# Patient Record
Sex: Female | Born: 1980 | Race: White | Hispanic: No | Marital: Married | Smoking: Never smoker
Health system: Southern US, Community
[De-identification: ages and names within clinical notes are randomized; demographics above are authoritative.]

## PROBLEM LIST (undated history)

## (undated) DIAGNOSIS — F329 Major depressive disorder, single episode, unspecified: Secondary | ICD-10-CM

## (undated) DIAGNOSIS — R61 Generalized hyperhidrosis: Secondary | ICD-10-CM

## (undated) DIAGNOSIS — F32A Depression, unspecified: Secondary | ICD-10-CM

## (undated) DIAGNOSIS — D649 Anemia, unspecified: Secondary | ICD-10-CM

## (undated) DIAGNOSIS — M329 Systemic lupus erythematosus, unspecified: Secondary | ICD-10-CM

## (undated) DIAGNOSIS — R5383 Other fatigue: Secondary | ICD-10-CM

## (undated) DIAGNOSIS — D72819 Decreased white blood cell count, unspecified: Secondary | ICD-10-CM

## (undated) DIAGNOSIS — IMO0002 Reserved for concepts with insufficient information to code with codable children: Secondary | ICD-10-CM

## (undated) DIAGNOSIS — D696 Thrombocytopenia, unspecified: Secondary | ICD-10-CM

## (undated) DIAGNOSIS — F909 Attention-deficit hyperactivity disorder, unspecified type: Secondary | ICD-10-CM

## (undated) HISTORY — DX: Thrombocytopenia, unspecified: D69.6

## (undated) HISTORY — PX: LAPAROSCOPY: SHX197

## (undated) HISTORY — DX: Decreased white blood cell count, unspecified: D72.819

## (undated) HISTORY — DX: Generalized hyperhidrosis: R61

## (undated) HISTORY — DX: Other fatigue: R53.83

## (undated) HISTORY — PX: LEEP: SHX91

---

## 1998-11-15 ENCOUNTER — Encounter: Payer: Self-pay | Admitting: Emergency Medicine

## 1998-11-15 ENCOUNTER — Emergency Department (HOSPITAL_COMMUNITY): Admission: EM | Admit: 1998-11-15 | Discharge: 1998-11-15 | Payer: Self-pay | Admitting: Emergency Medicine

## 1998-12-06 ENCOUNTER — Ambulatory Visit (HOSPITAL_COMMUNITY): Admission: RE | Admit: 1998-12-06 | Discharge: 1998-12-06 | Payer: Self-pay | Admitting: Interventional Cardiology

## 1998-12-07 ENCOUNTER — Encounter: Payer: Self-pay | Admitting: Interventional Cardiology

## 2006-12-16 ENCOUNTER — Ambulatory Visit: Payer: Self-pay | Admitting: Obstetrics and Gynecology

## 2012-11-10 ENCOUNTER — Other Ambulatory Visit: Payer: Self-pay | Admitting: Family Medicine

## 2012-11-10 ENCOUNTER — Other Ambulatory Visit (HOSPITAL_COMMUNITY)
Admission: RE | Admit: 2012-11-10 | Discharge: 2012-11-10 | Disposition: A | Discharge status: Home / Self Care | Payer: BC Managed Care – PPO | Source: Ambulatory Visit | Attending: Family Medicine | Admitting: Family Medicine

## 2012-11-10 DIAGNOSIS — Z0000 Encounter for general adult medical examination without abnormal findings: Secondary | ICD-10-CM | POA: Insufficient documentation

## 2013-05-04 ENCOUNTER — Ambulatory Visit: Payer: Self-pay | Admitting: Hematology and Oncology

## 2013-05-04 ENCOUNTER — Ambulatory Visit: Payer: Self-pay | Admitting: Internal Medicine

## 2013-05-04 LAB — CBC CANCER CENTER
Basophil #: 0 x10 3/mm (ref 0.0–0.1)
Basophil %: 0.1 %
HGB: 12.8 g/dL (ref 12.0–16.0)
Lymphocyte %: 42.7 %
Monocyte #: 0.4 x10 3/mm (ref 0.2–0.9)
Monocyte %: 12 %
Neutrophil %: 44.1 %
Platelet: 138 x10 3/mm — ABNORMAL LOW (ref 150–440)
RBC: 4.25 10*6/uL (ref 3.80–5.20)
RDW: 13.2 % (ref 11.5–14.5)
WBC: 3 x10 3/mm — ABNORMAL LOW (ref 3.6–11.0)

## 2013-05-04 LAB — CREATININE, SERUM
Creatinine: 0.85 mg/dL (ref 0.60–1.30)
EGFR (African American): >60

## 2013-05-04 LAB — IRON AND TIBC: Iron: 90 ug/dL (ref 50–170)

## 2013-05-04 LAB — PROTIME-INR
INR: 1
Prothrombin Time: 13.2 secs (ref 11.5–14.7)

## 2013-05-04 LAB — LACTATE DEHYDROGENASE: LDH: 145 U/L (ref 81–246)

## 2013-05-11 LAB — CBC CANCER CENTER
Basophil #: 0 x10 3/mm (ref 0.0–0.1)
Eosinophil #: 0 x10 3/mm (ref 0.0–0.7)
HGB: 12.2 g/dL (ref 12.0–16.0)
Lymphocyte %: 46.5 %
Monocyte #: 0.4 x10 3/mm (ref 0.2–0.9)
Monocyte %: 12.6 %
Platelet: 121 x10 3/mm — ABNORMAL LOW (ref 150–440)
RBC: 3.97 10*6/uL (ref 3.80–5.20)
RDW: 13.3 % (ref 11.5–14.5)
WBC: 2.9 x10 3/mm — ABNORMAL LOW (ref 3.6–11.0)

## 2013-05-11 LAB — URINALYSIS, COMPLETE
Bacteria: NEGATIVE
Bilirubin,UR: NEGATIVE
Budding Yeast: NONE SEEN
Hyaline Cast: NONE SEEN
Leukocyte Esterase: NEGATIVE
Nitrite: NEGATIVE
Ph: 6 (ref 4.5–8.0)
RBC,UR: NONE SEEN /HPF (ref 0–5)
Specific Gravity: 1.025 (ref 1.003–1.030)
Trichomonas: NONE SEEN

## 2013-05-21 ENCOUNTER — Ambulatory Visit: Payer: Self-pay | Admitting: Hematology and Oncology

## 2013-05-21 ENCOUNTER — Ambulatory Visit: Payer: Self-pay | Admitting: Internal Medicine

## 2013-05-26 LAB — CBC CANCER CENTER
Basophil #: 0 x10 3/mm (ref 0.0–0.1)
Eosinophil %: 0.6 %
Lymphocyte %: 43.4 %
MCH: 31.4 pg (ref 26.0–34.0)
MCHC: 35 g/dL (ref 32.0–36.0)
MCV: 90 fL (ref 80–100)
Monocyte #: 0.3 x10 3/mm (ref 0.2–0.9)
Monocyte %: 11.3 %
Neutrophil #: 1.3 x10 3/mm — ABNORMAL LOW (ref 1.4–6.5)
Neutrophil %: 44.6 %
RBC: 3.93 10*6/uL (ref 3.80–5.20)
RDW: 13.3 % (ref 11.5–14.5)

## 2013-05-27 ENCOUNTER — Ambulatory Visit: Payer: Self-pay | Admitting: Internal Medicine

## 2013-06-08 ENCOUNTER — Encounter (INDEPENDENT_AMBULATORY_CARE_PROVIDER_SITE_OTHER): Payer: Self-pay

## 2013-06-08 ENCOUNTER — Telehealth (INDEPENDENT_AMBULATORY_CARE_PROVIDER_SITE_OTHER): Payer: Self-pay

## 2013-06-08 NOTE — Telephone Encounter (Signed)
LM with Dr. Paula Compton office requesting pt bring films and reports of any mammograms performed at their facility.  If not performed at their facility, we need to know where so that films and reports can be requested prior to pt's 06/22/13 appointment with Dr. Abbey Chatters.

## 2013-06-08 NOTE — Telephone Encounter (Signed)
LMOV.  Pt will need to bring films and reports of any mammograms performed at [facility] to her 06/22/13 appointment with Dr. Donell Beers.  If not performed there, will need to bring films and reports from the correct facility.  Message also left with Dr. Paula Compton office.

## 2013-06-09 NOTE — Telephone Encounter (Signed)
Patient called back at this time and will go by to get the CD with her CT scan.  Patient has not had a mammogram done and was told by the referring physician he wasn't going to order one since we would want to get our own.  Patient has switched her appt from Dr. Donell Beers to Dr. Carolynne Edouard.

## 2013-06-10 ENCOUNTER — Encounter (INDEPENDENT_AMBULATORY_CARE_PROVIDER_SITE_OTHER): Payer: Self-pay | Admitting: General Surgery

## 2013-06-10 ENCOUNTER — Ambulatory Visit (INDEPENDENT_AMBULATORY_CARE_PROVIDER_SITE_OTHER): Payer: BC Managed Care – PPO | Admitting: General Surgery

## 2013-06-10 VITALS — BP 110/78 | HR 84 | Resp 20 | Ht 64.0 in | Wt 150.0 lb

## 2013-06-10 DIAGNOSIS — Q5039 Other congenital malformation of ovary: Secondary | ICD-10-CM

## 2013-06-10 DIAGNOSIS — R590 Localized enlarged lymph nodes: Secondary | ICD-10-CM | POA: Insufficient documentation

## 2013-06-10 DIAGNOSIS — R599 Enlarged lymph nodes, unspecified: Secondary | ICD-10-CM

## 2013-06-10 NOTE — Progress Notes (Signed)
Patient ID: Jennifer Bates, female   DOB: 1981/10/07, 32 y.o.   MRN: [id number]  Chief Complaint  Patient presents with  . New Evaluation    eval nipple discharge   . Biopsy    HPI Jennifer Bates is a 32 y.o. female.  We are asked to see the patient in consultation by Dr. Lorre Nick to evaluate her for abnormal axillary lymph nodes. The patient initially went for a routine checkup when she told her medical doctor that she is just not been feeling very well. She had been complaining of shortness of breath with minimal activity. She has also been experiencing night sweats for the last 3-6 months. She did have an episode of clear discharge from her right nipple about 2 months ago. She has changed her diet and has had an intentional weight loss of 13 pounds. She denies any abdominal pain. Her appetite has been good and her bowels are working normally. She had blood work that showed a low white count and low platelets. There is also some question on her blood work of an autoimmune abnormality. She also had a PET scan that suggested enlarged and hypermetabolic axillary lymph nodes bilaterally as well as a hypermetabolic left ovary.  HPI  Past Medical History  Diagnosis Date  . Thrombocytopenia   . Leukopenia   . Fatigue   . Night sweats     Past Surgical History  Procedure Laterality Date  . Cesarean section      2004 & 2006    Family History  Problem Relation Age of Onset  . Cancer Paternal Grandmother     stomach / ovarian  . Cancer Maternal Grandfather     colon    Social History History  Substance Use Topics  . Smoking status: Never Smoker   . Smokeless tobacco: Never Used  . Alcohol Use: No    Allergies  Allergen Reactions  . Strattera [Atomoxetine Hcl] Rash  . Sulfa Antibiotics Rash    No current outpatient prescriptions on file.   No current facility-administered medications for this visit.    Review of Systems Review of Systems  Constitutional: Negative.   HENT:  Negative.   Eyes: Negative.   Respiratory: Negative.   Cardiovascular: Negative.   Gastrointestinal: Negative.   Endocrine: Negative.   Genitourinary: Negative.   Musculoskeletal: Negative.   Skin: Negative.   Allergic/Immunologic: Negative.   Neurological: Negative.   Hematological: Negative.   Psychiatric/Behavioral: Negative.     Blood pressure 110/78, pulse 84, resp. rate 20, height 5\' 4"  (1.626 m), weight 150 lb (68.04 kg).  Physical Exam Physical Exam  Constitutional: She is oriented to person, place, and time. She appears well-developed and well-nourished.  HENT:  Head: Normocephalic and atraumatic.  Eyes: Conjunctivae and EOM are normal. Pupils are equal, round, and reactive to light.  Neck: Normal range of motion. Neck supple.  Cardiovascular: Normal rate, regular rhythm and normal heart sounds.   Pulmonary/Chest: Effort normal and breath sounds normal.  She has typical nodular fibrocystic tissue bilaterally but no significant palpable mass in either breast. There is no palpable axillary, cervical, or supraclavicular lymphadenopathy.  Abdominal: Soft. Bowel sounds are normal. She exhibits no mass. There is no tenderness.  Musculoskeletal: Normal range of motion.  Lymphadenopathy:    She has no cervical adenopathy.  Neurological: She is alert and oriented to person, place, and time.  Skin: Skin is warm and dry.  Psychiatric: She has a normal mood and affect. Her behavior is normal.  Data Reviewed As above  Assessment    The patient appears to have abnormal bilateral axillary lymph nodes by PET scan but clinically her nodes are nonpalpable. She has had some breast symptoms. Because of this we will start with a mammogram and ultrasound evaluation of her breast and axilla. We will also have her undergo a pelvic ultrasound because of the abnormal left ovary on PET scan. Once we have this information we will decide whether it would be appropriate to do a axillary lymph  node biopsy.     Plan    Plan for mammogram and ultrasound of her breasts as well as pelvic ultrasound. We will see her back in about 2 weeks to go over the results of the studies        TOTH III,PAUL S 06/10/2013, 4:57 PM

## 2013-06-11 ENCOUNTER — Telehealth (INDEPENDENT_AMBULATORY_CARE_PROVIDER_SITE_OTHER): Payer: Self-pay | Admitting: General Surgery

## 2013-06-11 NOTE — Telephone Encounter (Signed)
Spoke with pt and informed her that I scheduled her for her US pelvis at [facility] located at [address] on 07/01/13 at 8:45 and explained that she will need a full bladder for this procedure.  Also explained that she is set up with the [facility] on 07/01/13 at 10:30 for her MGM and [place].  We also rescheduled for f/u appt w/ Dr. 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

## 2013-06-21 ENCOUNTER — Ambulatory Visit: Payer: Self-pay | Admitting: Hematology and Oncology

## 2013-06-21 ENCOUNTER — Ambulatory Visit: Payer: Self-pay

## 2013-06-22 ENCOUNTER — Ambulatory Visit (INDEPENDENT_AMBULATORY_CARE_PROVIDER_SITE_OTHER): Payer: BC Managed Care – PPO | Admitting: General Surgery

## 2013-06-25 ENCOUNTER — Encounter (INDEPENDENT_AMBULATORY_CARE_PROVIDER_SITE_OTHER): Payer: BC Managed Care – PPO | Admitting: General Surgery

## 2013-07-01 ENCOUNTER — Ambulatory Visit
Admission: RE | Admit: 2013-07-01 | Discharge: 2013-07-01 | Disposition: A | Discharge status: Home / Self Care | Payer: BC Managed Care – PPO | Source: Ambulatory Visit | Attending: General Surgery | Admitting: General Surgery

## 2013-07-01 ENCOUNTER — Other Ambulatory Visit (INDEPENDENT_AMBULATORY_CARE_PROVIDER_SITE_OTHER): Payer: Self-pay | Admitting: General Surgery

## 2013-07-01 DIAGNOSIS — R590 Localized enlarged lymph nodes: Secondary | ICD-10-CM

## 2013-07-01 DIAGNOSIS — Q5039 Other congenital malformation of ovary: Secondary | ICD-10-CM

## 2013-07-05 ENCOUNTER — Ambulatory Visit (INDEPENDENT_AMBULATORY_CARE_PROVIDER_SITE_OTHER): Payer: BC Managed Care – PPO | Admitting: General Surgery

## 2013-07-05 ENCOUNTER — Encounter (INDEPENDENT_AMBULATORY_CARE_PROVIDER_SITE_OTHER): Payer: Self-pay | Admitting: General Surgery

## 2013-07-05 VITALS — BP 122/70 | HR 104 | Resp 16 | Ht 64.0 in | Wt 149.6 lb

## 2013-07-05 DIAGNOSIS — R590 Localized enlarged lymph nodes: Secondary | ICD-10-CM

## 2013-07-05 DIAGNOSIS — R599 Enlarged lymph nodes, unspecified: Secondary | ICD-10-CM

## 2013-07-05 NOTE — Patient Instructions (Signed)
Will refer to rheumatologist once we have a name

## 2013-07-05 NOTE — Progress Notes (Signed)
Subjective:     Patient ID: Jennifer Bates, female   DOB: Jul 05, 1981, 32 y.o.   MRN: [id number]  HPI The patient is a 32 year old white female who we saw recently with symptoms of generalized malaise for the last few months. She had a PET scan that showed hypermetabolic lymph nodes in the right axilla and left ovary. Since her last visit we had her undergo an ultrasound-guided core biopsy of the abnormal lymph node in the right axilla since we were unable to palpate it. Her pathology showed this to be a benign reactive lymph node. It appears as though they were able to do flow cytometry On it to rule out lymphoma.  Review of Systems     Objective:   Physical Exam On exam she does have some bruising in the right axilla. She still does not have any palpable lymph nodes in either axilla.    Assessment:     The patient is still in the middle of the workup to rule out an autoimmune disorder versus a lymphoma     Plan:     At this point I think it would be reasonable to refer her to a rheumatologist to review her workup. We will be happy to refer her to a rheumatologist or she can cause with a minimal somebody she would like to see. Otherwise we will plan to see her back on a when necessary basis.

## 2013-07-08 ENCOUNTER — Telehealth (INDEPENDENT_AMBULATORY_CARE_PROVIDER_SITE_OTHER): Payer: Self-pay

## 2013-07-08 ENCOUNTER — Other Ambulatory Visit (INDEPENDENT_AMBULATORY_CARE_PROVIDER_SITE_OTHER): Payer: Self-pay | Admitting: General Surgery

## 2013-07-08 DIAGNOSIS — R590 Localized enlarged lymph nodes: Secondary | ICD-10-CM

## 2013-07-08 NOTE — Telephone Encounter (Signed)
Pt would like to be referred to Dr. Corliss Skains, Rheumatology.  She is also asking questions about her biopsy and testing for autoimmune disease.  Please call her.  She is at work today. [phone].  Ext. [phone].

## 2013-07-09 ENCOUNTER — Other Ambulatory Visit (INDEPENDENT_AMBULATORY_CARE_PROVIDER_SITE_OTHER): Payer: Self-pay

## 2013-07-09 DIAGNOSIS — R591 Generalized enlarged lymph nodes: Secondary | ICD-10-CM

## 2013-07-09 NOTE — Telephone Encounter (Signed)
LMOM> tried to call her back to see what her questions were. If she is wanting cytology results they are not back yet. If any other question please get them from her. I will call her back with answers.

## 2013-07-14 ENCOUNTER — Telehealth (INDEPENDENT_AMBULATORY_CARE_PROVIDER_SITE_OTHER): Payer: Self-pay

## 2013-07-14 NOTE — Telephone Encounter (Signed)
Per pts request appt request form and records refaxed to Dr Fatima Sanger office. Fax Confirmation received. Donnie at office stated they did receive records and request. This will be forwarded to [facility] the nurse and reviewed for appt. Donnie states this may take a few days for appt to be set. LMOM for pt with update on appt status.

## 2013-08-26 ENCOUNTER — Other Ambulatory Visit: Payer: Self-pay

## 2014-02-01 ENCOUNTER — Other Ambulatory Visit: Payer: Self-pay | Admitting: Family

## 2014-02-01 DIAGNOSIS — R1011 Right upper quadrant pain: Secondary | ICD-10-CM

## 2014-02-02 ENCOUNTER — Ambulatory Visit
Admission: RE | Admit: 2014-02-02 | Discharge: 2014-02-02 | Disposition: A | Discharge status: Home / Self Care | Payer: BC Managed Care – PPO | Source: Ambulatory Visit | Attending: Family | Admitting: Family

## 2014-02-02 DIAGNOSIS — R1011 Right upper quadrant pain: Secondary | ICD-10-CM

## 2014-08-29 ENCOUNTER — Other Ambulatory Visit (INDEPENDENT_AMBULATORY_CARE_PROVIDER_SITE_OTHER): Payer: Self-pay | Admitting: General Surgery

## 2014-09-08 ENCOUNTER — Telehealth (INDEPENDENT_AMBULATORY_CARE_PROVIDER_SITE_OTHER): Payer: Self-pay

## 2014-09-08 NOTE — Telephone Encounter (Signed)
lmom to call back for below path report

## 2014-09-08 NOTE — Telephone Encounter (Signed)
-----   Message from Chevis PrettyPaul Toth III, MD sent at 09/05/2014  9:05 AM EST ----- Benign lipoma

## 2015-06-09 ENCOUNTER — Encounter: Payer: BC Managed Care – PPO | Admitting: Obstetrics & Gynecology

## 2015-06-22 ENCOUNTER — Encounter: Payer: Self-pay | Admitting: Obstetrics and Gynecology

## 2015-06-22 ENCOUNTER — Ambulatory Visit (INDEPENDENT_AMBULATORY_CARE_PROVIDER_SITE_OTHER): Payer: BC Managed Care – PPO | Admitting: Obstetrics and Gynecology

## 2015-06-22 VITALS — BP 106/73 | HR 96 | Ht 64.0 in | Wt 171.0 lb

## 2015-06-22 DIAGNOSIS — Z1151 Encounter for screening for human papillomavirus (HPV): Secondary | ICD-10-CM

## 2015-06-22 DIAGNOSIS — Z124 Encounter for screening for malignant neoplasm of cervix: Secondary | ICD-10-CM

## 2015-06-22 DIAGNOSIS — Z01419 Encounter for gynecological examination (general) (routine) without abnormal findings: Secondary | ICD-10-CM

## 2015-06-22 LAB — TSH: TSH: 0.697 u[IU]/mL (ref 0.350–4.500)

## 2015-06-22 NOTE — Progress Notes (Signed)
  Subjective:     Jennifer Bates is a 34 y.o. female G2P2 with LMP 06/15/2015 and BMI 29 who is here for a comprehensive physical exam. The patient reports no problems. She is sexually active using vasectomy for contraception. She reports regular monthly menses lasting 5 days. She does endorse some night sweats and reports that her mother started menopause at the age of 9  Social History   Social History  . Marital Status: Married    Spouse Name: N/A  . Number of Children: N/A  . Years of Education: N/A   Occupational History  . Not on file.   Social History Main Topics  . Smoking status: Never Smoker   . Smokeless tobacco: Never Used  . Alcohol Use: No  . Drug Use: No  . Sexual Activity:    Partners: Male   Other Topics Concern  . Not on file   Social History Narrative   Health Maintenance  Topic Date Due  . HIV Screening  05/20/1994  . TETANUS/TDAP  05/20/1998  . INFLUENZA VACCINE  05/22/2015   Past Medical History  Diagnosis Date  . Thrombocytopenia   . Leukopenia   . Fatigue   . Night sweats    Past Surgical History  Procedure Laterality Date  . Cesarean section      2004 & 2006   Family History  Problem Relation Age of Onset  . Cancer Paternal Grandmother     stomach / ovarian  . Cancer Maternal Grandfather     colon       Review of Systems Pertinent items are noted in HPI.   Objective:  Blood pressure 106/73, pulse 96, height  (1.626 m), weight 171 lb (77.565 kg), last menstrual period 06/15/2015.     GENERAL: Well-developed, well-nourished female in no acute distress.  HEENT: Normocephalic, atraumatic. Sclerae anicteric.  NECK: Supple. Normal thyroid.  LUNGS: Clear to auscultation bilaterally.  HEART: Regular rate and rhythm. BREASTS: Symmetric in size. No palpable masses or lymphadenopathy, skin changes, or nipple drainage. ABDOMEN: Soft, nontender, nondistended. No organomegaly. PELVIC: Normal external female genitalia. Vagina is  pink and rugated.  Normal discharge. Normal appearing cervix. Uterus is normal in size. No adnexal mass or tenderness. EXTREMITIES: No cyanosis, clubbing, or edema, 2+ distal pulses.    Assessment:    Healthy female exam.      Plan:    pap smear collected Patient advised to perform monthly self breast exam  Will check a TSH See After Visit Summary for Counseling Recommendations

## 2015-06-23 LAB — CYTOLOGY - PAP

## 2016-09-30 ENCOUNTER — Other Ambulatory Visit: Payer: Self-pay | Admitting: Family

## 2016-09-30 DIAGNOSIS — R1011 Right upper quadrant pain: Secondary | ICD-10-CM

## 2016-10-01 ENCOUNTER — Other Ambulatory Visit: Payer: Self-pay | Admitting: Family

## 2016-10-01 DIAGNOSIS — N644 Mastodynia: Secondary | ICD-10-CM

## 2016-10-08 ENCOUNTER — Ambulatory Visit
Admission: RE | Admit: 2016-10-08 | Discharge: 2016-10-08 | Disposition: A | Discharge status: Home / Self Care | Payer: BC Managed Care – PPO | Source: Ambulatory Visit | Attending: Family | Admitting: Family

## 2016-10-08 DIAGNOSIS — N644 Mastodynia: Secondary | ICD-10-CM

## 2017-10-17 ENCOUNTER — Other Ambulatory Visit (HOSPITAL_COMMUNITY)
Admission: RE | Admit: 2017-10-17 | Discharge: 2017-10-17 | Disposition: A | Discharge status: Home / Self Care | Payer: BC Managed Care – PPO | Source: Ambulatory Visit | Attending: Nurse Practitioner | Admitting: Nurse Practitioner

## 2017-10-17 ENCOUNTER — Other Ambulatory Visit: Payer: Self-pay | Admitting: Nurse Practitioner

## 2017-10-17 DIAGNOSIS — Z124 Encounter for screening for malignant neoplasm of cervix: Secondary | ICD-10-CM | POA: Insufficient documentation

## 2017-10-24 LAB — CYTOLOGY - PAP
Adequacy: ABSENT — AB
Candida vaginitis: NEGATIVE
Chlamydia: NEGATIVE
HPV (WINDOPATH): DETECTED — AB
HPV 16/18/45 GENOTYPING: NEGATIVE
Neisseria Gonorrhea: NEGATIVE
Trichomonas: NEGATIVE

## 2018-04-20 ENCOUNTER — Other Ambulatory Visit: Payer: Self-pay | Admitting: Obstetrics and Gynecology

## 2018-06-29 NOTE — Patient Instructions (Addendum)
Your procedure is scheduled on:  Wednesday, 9/18  Enter through the Main Entrance of [facility] at: 12:45 pm  Pick up the phone at the desk and dial [phone].  Call this number if you have problems the morning of surgery: [phone].  Remember: Do NOT eat food after midnight Tuesday.  Do NOT drink clear liquids (including water) after: 8 am Wednesday, day of surgery.  Take these medicines the morning of surgery with a SIP OF WATER: valtrex if needed  Brush your teeth on the day of surgery.  Stop herbal medications, vitamin supplements, Ibuprofen/NSAIDS at this time.  Do NOT wear jewelry (body piercing), metal hair clips/bobby pins, make-up, or nail polish. Do NOT wear lotions, powders, or perfumes.  You may wear deoderant. Do NOT shave for 48 hours prior to surgery. Do NOT bring valuables to the hospital. Contacts may not be worn into surgery.  Leave suitcase in car.  After surgery it may be brought to your room.  For patients admitted to the hospital, checkout time is 11:00 AM the day of discharge. Home with Mother Boyd Kerbs cell [phone]

## 2018-07-01 ENCOUNTER — Other Ambulatory Visit: Payer: Self-pay | Admitting: Obstetrics and Gynecology

## 2018-07-03 ENCOUNTER — Encounter (HOSPITAL_COMMUNITY): Payer: Self-pay

## 2018-07-03 ENCOUNTER — Other Ambulatory Visit: Payer: Self-pay

## 2018-07-03 ENCOUNTER — Encounter (HOSPITAL_COMMUNITY)
Admission: RE | Admit: 2018-07-03 | Discharge: 2018-07-03 | Disposition: A | Discharge status: Home / Self Care | Payer: BC Managed Care – PPO | Source: Ambulatory Visit | Attending: Obstetrics and Gynecology | Admitting: Obstetrics and Gynecology

## 2018-07-03 DIAGNOSIS — Z01812 Encounter for preprocedural laboratory examination: Secondary | ICD-10-CM | POA: Insufficient documentation

## 2018-07-03 HISTORY — DX: Major depressive disorder, single episode, unspecified: F32.9

## 2018-07-03 HISTORY — DX: Depression, unspecified: F32.A

## 2018-07-03 HISTORY — DX: Attention-deficit hyperactivity disorder, unspecified type: F90.9

## 2018-07-03 HISTORY — DX: Anemia, unspecified: D64.9

## 2018-07-03 HISTORY — DX: Reserved for concepts with insufficient information to code with codable children: IMO0002

## 2018-07-03 HISTORY — DX: Systemic lupus erythematosus, unspecified: M32.9

## 2018-07-03 LAB — CBC
HCT: 36.7 % (ref 36.0–46.0)
Hemoglobin: 12.4 g/dL (ref 12.0–15.0)
MCH: 30.9 pg (ref 26.0–34.0)
MCHC: 33.8 g/dL (ref 30.0–36.0)
MCV: 91.5 fL (ref 78.0–100.0)
Platelets: 147 10*3/uL — ABNORMAL LOW (ref 150–400)
RBC: 4.01 MIL/uL (ref 3.87–5.11)
RDW: 13.3 % (ref 11.5–15.5)
WBC: 3.2 10*3/uL — ABNORMAL LOW (ref 4.0–10.5)

## 2018-07-03 LAB — TYPE AND SCREEN
ABO/RH(D): A POS
ANTIBODY SCREEN: NEGATIVE

## 2018-07-03 LAB — COMPREHENSIVE METABOLIC PANEL
ALK PHOS: 59 U/L (ref 38–126)
ALT: 11 U/L (ref 0–44)
AST: 18 U/L (ref 15–41)
Albumin: 4.4 g/dL (ref 3.5–5.0)
Anion gap: 7 (ref 5–15)
BUN: 14 mg/dL (ref 6–20)
CO2: 25 mmol/L (ref 22–32)
CREATININE: 0.78 mg/dL (ref 0.44–1.00)
Calcium: 8.9 mg/dL (ref 8.9–10.3)
Chloride: 107 mmol/L (ref 98–111)
GFR calc non Af Amer: >60 mL/min (ref >60)
Glucose, Bld: 90 mg/dL (ref 70–99)
Potassium: 4.8 mmol/L (ref 3.5–5.1)
Sodium: 139 mmol/L (ref 135–145)
Total Bilirubin: 0.2 mg/dL — ABNORMAL LOW (ref 0.3–1.2)
Total Protein: 7.3 g/dL (ref 6.5–8.1)

## 2018-07-03 LAB — ABO/RH: ABO/RH(D): A POS

## 2018-07-08 ENCOUNTER — Inpatient Hospital Stay (HOSPITAL_COMMUNITY): Payer: BC Managed Care – PPO | Admitting: Certified Registered Nurse Anesthetist

## 2018-07-08 ENCOUNTER — Other Ambulatory Visit: Payer: Self-pay

## 2018-07-08 ENCOUNTER — Other Ambulatory Visit: Payer: Self-pay | Admitting: Obstetrics and Gynecology

## 2018-07-08 ENCOUNTER — Encounter (HOSPITAL_COMMUNITY): Payer: Self-pay

## 2018-07-08 ENCOUNTER — Inpatient Hospital Stay (HOSPITAL_COMMUNITY)
Admission: AD | Admit: 2018-07-08 | Discharge: 2018-07-10 | DRG: 743 | Disposition: A | Discharge status: Home / Self Care | Payer: BC Managed Care – PPO | Attending: Obstetrics and Gynecology | Admitting: Obstetrics and Gynecology

## 2018-07-08 ENCOUNTER — Encounter (HOSPITAL_COMMUNITY)
Admission: AD | Disposition: A | Discharge status: Home / Self Care | Payer: Self-pay | Source: Home / Self Care | Attending: Obstetrics and Gynecology

## 2018-07-08 DIAGNOSIS — Z90710 Acquired absence of both cervix and uterus: Secondary | ICD-10-CM | POA: Diagnosis present

## 2018-07-08 DIAGNOSIS — Z98891 History of uterine scar from previous surgery: Secondary | ICD-10-CM | POA: Diagnosis not present

## 2018-07-08 DIAGNOSIS — N871 Moderate cervical dysplasia: Principal | ICD-10-CM | POA: Diagnosis present

## 2018-07-08 HISTORY — PX: HYSTERECTOMY ABDOMINAL WITH SALPINGECTOMY: SHX6725

## 2018-07-08 LAB — PREGNANCY, URINE: Preg Test, Ur: NEGATIVE

## 2018-07-08 SURGERY — HYSTERECTOMY, TOTAL, ABDOMINAL, WITH SALPINGECTOMY
Anesthesia: General | Site: Abdomen | Laterality: Bilateral

## 2018-07-08 MED ORDER — ONDANSETRON HCL 4 MG/2ML IJ SOLN
INTRAMUSCULAR | Status: DC | PRN
  Administered 2018-07-08: 4 mg via INTRAVENOUS

## 2018-07-08 MED ORDER — SUGAMMADEX SODIUM 200 MG/2ML IV SOLN
INTRAVENOUS | Status: DC | PRN
  Administered 2018-07-08: 133.4 mg via INTRAVENOUS

## 2018-07-08 MED ORDER — HYDROMORPHONE HCL 1 MG/ML IJ SOLN
0.2500 mg | INTRAMUSCULAR | Status: DC | PRN
  Administered 2018-07-08 (×2): 0.5 mg via INTRAVENOUS

## 2018-07-08 MED ORDER — HYDROMORPHONE HCL 1 MG/ML IJ SOLN
INTRAMUSCULAR | Status: AC
  Administered 2018-07-08: 0.5 mg via INTRAVENOUS
  Filled 2018-07-08: qty 0.5

## 2018-07-08 MED ORDER — LIDOCAINE HCL (CARDIAC) PF 100 MG/5ML IV SOSY
PREFILLED_SYRINGE | INTRAVENOUS | Status: AC
  Filled 2018-07-08: qty 5

## 2018-07-08 MED ORDER — SCOPOLAMINE 1 MG/3DAYS TD PT72
MEDICATED_PATCH | TRANSDERMAL | Status: AC
  Filled 2018-07-08: qty 1

## 2018-07-08 MED ORDER — DIPHENHYDRAMINE HCL 12.5 MG/5ML PO ELIX: 12.5000 mg | ORAL_SOLUTION | Freq: Four times a day (QID) | ORAL | Status: DC | PRN

## 2018-07-08 MED ORDER — ACETAMINOPHEN 10 MG/ML IV SOLN
1000.0000 mg | Freq: Once | INTRAVENOUS | Status: AC
  Administered 2018-07-08: 1000 mg via INTRAVENOUS

## 2018-07-08 MED ORDER — MEPERIDINE HCL 25 MG/ML IJ SOLN
6.2500 mg | INTRAMUSCULAR | Status: DC | PRN
  Administered 2018-07-08 (×2): 12.5 mg via INTRAVENOUS

## 2018-07-08 MED ORDER — ROCURONIUM BROMIDE 100 MG/10ML IV SOLN
INTRAVENOUS | Status: AC
  Filled 2018-07-08: qty 1

## 2018-07-08 MED ORDER — KETOROLAC TROMETHAMINE 30 MG/ML IJ SOLN
30.0000 mg | Freq: Four times a day (QID) | INTRAMUSCULAR | Status: DC
  Administered 2018-07-09 (×2): 30 mg via INTRAVENOUS
  Filled 2018-07-08 (×2): qty 1

## 2018-07-08 MED ORDER — IBUPROFEN 800 MG PO TABS
800.0000 mg | ORAL_TABLET | Freq: Three times a day (TID) | ORAL | Status: DC | PRN
  Administered 2018-07-09 – 2018-07-10 (×3): 800 mg via ORAL
  Filled 2018-07-08 (×3): qty 1

## 2018-07-08 MED ORDER — ONDANSETRON HCL 4 MG/2ML IJ SOLN: 4.0000 mg | Freq: Four times a day (QID) | INTRAMUSCULAR | Status: DC | PRN

## 2018-07-08 MED ORDER — DEXAMETHASONE SODIUM PHOSPHATE 4 MG/ML IJ SOLN
INTRAMUSCULAR | Status: AC
  Filled 2018-07-08: qty 1

## 2018-07-08 MED ORDER — PANTOPRAZOLE SODIUM 40 MG PO TBEC
40.0000 mg | DELAYED_RELEASE_TABLET | Freq: Every day | ORAL | Status: DC
  Administered 2018-07-09: 40 mg via ORAL
  Filled 2018-07-08: qty 1

## 2018-07-08 MED ORDER — SUGAMMADEX SODIUM 200 MG/2ML IV SOLN
INTRAVENOUS | Status: AC
  Filled 2018-07-08: qty 2

## 2018-07-08 MED ORDER — NALOXONE HCL 0.4 MG/ML IJ SOLN: 0.4000 mg | INTRAMUSCULAR | Status: DC | PRN

## 2018-07-08 MED ORDER — OXYCODONE-ACETAMINOPHEN 5-325 MG PO TABS
1.0000 | ORAL_TABLET | ORAL | Status: DC | PRN
  Administered 2018-07-09 – 2018-07-10 (×3): 1 via ORAL
  Filled 2018-07-08 (×3): qty 1

## 2018-07-08 MED ORDER — SCOPOLAMINE 1 MG/3DAYS TD PT72
1.0000 | MEDICATED_PATCH | Freq: Once | TRANSDERMAL | Status: DC
  Administered 2018-07-08: 1.5 mg via TRANSDERMAL

## 2018-07-08 MED ORDER — HYDROMORPHONE 1 MG/ML IV SOLN
INTRAVENOUS | Status: DC
  Administered 2018-07-08: 3.6 mg via INTRAVENOUS
  Administered 2018-07-08: 30 mg via INTRAVENOUS
  Administered 2018-07-09: 1.2 mg via INTRAVENOUS
  Administered 2018-07-09: 0.3 mg via INTRAVENOUS
  Filled 2018-07-08: qty 30

## 2018-07-08 MED ORDER — HYDROMORPHONE HCL 1 MG/ML IJ SOLN
INTRAMUSCULAR | Status: AC
  Administered 2018-07-08: 0.5 mg via INTRAVENOUS
  Filled 2018-07-08: qty 1

## 2018-07-08 MED ORDER — PROPOFOL 10 MG/ML IV BOLUS
INTRAVENOUS | Status: DC | PRN
  Administered 2018-07-08: 200 mg via INTRAVENOUS

## 2018-07-08 MED ORDER — FENTANYL CITRATE (PF) 100 MCG/2ML IJ SOLN
INTRAMUSCULAR | Status: DC | PRN
  Administered 2018-07-08 (×2): 100 ug via INTRAVENOUS
  Administered 2018-07-08: 50 ug via INTRAVENOUS
  Administered 2018-07-08: 100 ug via INTRAVENOUS
  Administered 2018-07-08: 50 ug via INTRAVENOUS
  Administered 2018-07-08: 100 ug via INTRAVENOUS

## 2018-07-08 MED ORDER — LACTATED RINGERS IV SOLN: INTRAVENOUS | Status: DC

## 2018-07-08 MED ORDER — VALACYCLOVIR HCL 500 MG PO TABS
500.0000 mg | ORAL_TABLET | Freq: Every evening | ORAL | Status: DC
  Administered 2018-07-08 – 2018-07-09 (×2): 500 mg via ORAL
  Filled 2018-07-08 (×2): qty 1

## 2018-07-08 MED ORDER — PROMETHAZINE HCL 25 MG/ML IJ SOLN
INTRAMUSCULAR | Status: AC
  Filled 2018-07-08: qty 1

## 2018-07-08 MED ORDER — ONDANSETRON HCL 4 MG/2ML IJ SOLN
INTRAMUSCULAR | Status: AC
  Filled 2018-07-08: qty 2

## 2018-07-08 MED ORDER — MENTHOL 3 MG MT LOZG: 1.0000 | LOZENGE | OROMUCOSAL | Status: DC | PRN

## 2018-07-08 MED ORDER — SIMETHICONE 80 MG PO CHEW
80.0000 mg | CHEWABLE_TABLET | Freq: Four times a day (QID) | ORAL | Status: DC | PRN
  Administered 2018-07-09 – 2018-07-10 (×4): 80 mg via ORAL
  Filled 2018-07-08 (×4): qty 1

## 2018-07-08 MED ORDER — MIDAZOLAM HCL 2 MG/2ML IJ SOLN
INTRAMUSCULAR | Status: AC
  Filled 2018-07-08: qty 2

## 2018-07-08 MED ORDER — PROPOFOL 10 MG/ML IV BOLUS
INTRAVENOUS | Status: AC
  Filled 2018-07-08: qty 20

## 2018-07-08 MED ORDER — OXYCODONE-ACETAMINOPHEN 5-325 MG PO TABS: 2.0000 | ORAL_TABLET | ORAL | Status: DC | PRN

## 2018-07-08 MED ORDER — GLYCOPYRROLATE 0.2 MG/ML IJ SOLN
INTRAMUSCULAR | Status: DC | PRN
  Administered 2018-07-08: 0.1 mg via INTRAVENOUS

## 2018-07-08 MED ORDER — SENNA 8.6 MG PO TABS
1.0000 | ORAL_TABLET | Freq: Two times a day (BID) | ORAL | Status: DC
  Administered 2018-07-08 – 2018-07-09 (×3): 8.6 mg via ORAL
  Filled 2018-07-08 (×4): qty 1

## 2018-07-08 MED ORDER — KETOROLAC TROMETHAMINE 30 MG/ML IJ SOLN
INTRAMUSCULAR | Status: AC
  Filled 2018-07-08: qty 1

## 2018-07-08 MED ORDER — METOCLOPRAMIDE HCL 5 MG/ML IJ SOLN
10.0000 mg | Freq: Once | INTRAMUSCULAR | Status: AC | PRN
  Administered 2018-07-08: 10 mg via INTRAVENOUS

## 2018-07-08 MED ORDER — FENTANYL CITRATE (PF) 250 MCG/5ML IJ SOLN
INTRAMUSCULAR | Status: AC
  Filled 2018-07-08: qty 5

## 2018-07-08 MED ORDER — HYDROMORPHONE HCL 1 MG/ML IJ SOLN
0.2500 mg | INTRAMUSCULAR | Status: DC | PRN
  Administered 2018-07-08 (×4): 0.5 mg via INTRAVENOUS

## 2018-07-08 MED ORDER — KETOROLAC TROMETHAMINE 30 MG/ML IJ SOLN
INTRAMUSCULAR | Status: DC | PRN
  Administered 2018-07-08: 30 mg via INTRAVENOUS

## 2018-07-08 MED ORDER — SODIUM CHLORIDE 0.9 % IR SOLN
Status: DC | PRN
  Administered 2018-07-08: 1000 mL

## 2018-07-08 MED ORDER — DIPHENHYDRAMINE HCL 50 MG/ML IJ SOLN: 12.5000 mg | Freq: Four times a day (QID) | INTRAMUSCULAR | Status: DC | PRN

## 2018-07-08 MED ORDER — ACETAMINOPHEN 10 MG/ML IV SOLN
INTRAVENOUS | Status: AC
  Administered 2018-07-08: 1000 mg via INTRAVENOUS
  Filled 2018-07-08: qty 100

## 2018-07-08 MED ORDER — ROCURONIUM BROMIDE 100 MG/10ML IV SOLN
INTRAVENOUS | Status: DC | PRN
  Administered 2018-07-08: 50 mg via INTRAVENOUS
  Administered 2018-07-08: 20 mg via INTRAVENOUS

## 2018-07-08 MED ORDER — SODIUM CHLORIDE 0.9 % IV SOLN
2.0000 g | INTRAVENOUS | Status: AC
  Administered 2018-07-08: 2 g via INTRAVENOUS

## 2018-07-08 MED ORDER — MEPERIDINE HCL 25 MG/ML IJ SOLN
INTRAMUSCULAR | Status: AC
  Administered 2018-07-08: 12.5 mg via INTRAVENOUS
  Filled 2018-07-08: qty 1

## 2018-07-08 MED ORDER — ALUM & MAG HYDROXIDE-SIMETH 200-200-20 MG/5ML PO SUSP: 30.0000 mL | ORAL | Status: DC | PRN

## 2018-07-08 MED ORDER — LIDOCAINE HCL (CARDIAC) PF 100 MG/5ML IV SOSY
PREFILLED_SYRINGE | INTRAVENOUS | Status: DC | PRN
  Administered 2018-07-08: 50 mg via INTRAVENOUS

## 2018-07-08 MED ORDER — SODIUM CHLORIDE 0.9 % IV SOLN
INTRAVENOUS | Status: AC
  Filled 2018-07-08: qty 2

## 2018-07-08 MED ORDER — LACTATED RINGERS IV SOLN
INTRAVENOUS | Status: DC
  Administered 2018-07-08: 16:00:00 via INTRAVENOUS
  Administered 2018-07-08: 125 mL/h via INTRAVENOUS
  Administered 2018-07-08: 15:00:00 via INTRAVENOUS

## 2018-07-08 MED ORDER — SODIUM CHLORIDE 0.9% FLUSH: 9.0000 mL | INTRAVENOUS | Status: DC | PRN

## 2018-07-08 MED ORDER — METOCLOPRAMIDE HCL 5 MG/ML IJ SOLN
INTRAMUSCULAR | Status: AC
  Administered 2018-07-08: 10 mg via INTRAVENOUS
  Filled 2018-07-08: qty 2

## 2018-07-08 MED ORDER — KETOROLAC TROMETHAMINE 30 MG/ML IJ SOLN: 30.0000 mg | Freq: Four times a day (QID) | INTRAMUSCULAR | Status: DC

## 2018-07-08 MED ORDER — ZOLPIDEM TARTRATE 5 MG PO TABS: 5.0000 mg | ORAL_TABLET | Freq: Every evening | ORAL | Status: DC | PRN

## 2018-07-08 MED ORDER — DEXAMETHASONE SODIUM PHOSPHATE 10 MG/ML IJ SOLN
INTRAMUSCULAR | Status: DC | PRN
  Administered 2018-07-08: 4 mg via INTRAVENOUS

## 2018-07-08 MED ORDER — MIDAZOLAM HCL 2 MG/2ML IJ SOLN
INTRAMUSCULAR | Status: DC | PRN
  Administered 2018-07-08: 2 mg via INTRAVENOUS

## 2018-07-08 SURGICAL SUPPLY — 41 items
BENZOIN TINCTURE PRP APPL 2/3 (GAUZE/BANDAGES/DRESSINGS) ×2 IMPLANT
CANISTER SUCT 3000ML PPV (MISCELLANEOUS) ×3 IMPLANT
CLOSURE WOUND 1/2 X4 (GAUZE/BANDAGES/DRESSINGS) ×1
CONT PATH 16OZ SNAP LID 3702 (MISCELLANEOUS) ×3 IMPLANT
DECANTER SPIKE VIAL GLASS SM (MISCELLANEOUS) IMPLANT
DRAPE WARM FLUID 44X44 (DRAPE) IMPLANT
DRSG OPSITE POSTOP 4X10 (GAUZE/BANDAGES/DRESSINGS) ×3 IMPLANT
DURAPREP 26ML APPLICATOR (WOUND CARE) ×3 IMPLANT
GAUZE 4X4 16PLY RFD (DISPOSABLE) ×3 IMPLANT
GAUZE SPONGE 4X4 12PLY STRL (GAUZE/BANDAGES/DRESSINGS) ×2 IMPLANT
GLOVE BIOGEL M 6.5 STRL (GLOVE) ×3 IMPLANT
GLOVE BIOGEL PI IND STRL 6.5 (GLOVE) ×1 IMPLANT
GLOVE BIOGEL PI IND STRL 7.0 (GLOVE) ×2 IMPLANT
GLOVE BIOGEL PI INDICATOR 6.5 (GLOVE) ×2
GLOVE BIOGEL PI INDICATOR 7.0 (GLOVE) ×4
GOWN STRL REUS W/TWL LRG LVL3 (GOWN DISPOSABLE) ×12 IMPLANT
HEMOSTAT ARISTA ABSORB 3G PWDR (MISCELLANEOUS) ×2 IMPLANT
NEEDLE HYPO 22GX1.5 SAFETY (NEEDLE) IMPLANT
NS IRRIG 1000ML POUR BTL (IV SOLUTION) ×3 IMPLANT
PACK ABDOMINAL GYN (CUSTOM PROCEDURE TRAY) ×3 IMPLANT
PAD ABD 8X7 1/2 STERILE (GAUZE/BANDAGES/DRESSINGS) ×2 IMPLANT
PAD OB MATERNITY 4.3X12.25 (PERSONAL CARE ITEMS) ×3 IMPLANT
PENCIL SMOKE EVAC W/HOLSTER (ELECTROSURGICAL) ×3 IMPLANT
PROTECTOR NERVE ULNAR (MISCELLANEOUS) ×3 IMPLANT
SPONGE LAP 18X18 X RAY DECT (DISPOSABLE) ×4 IMPLANT
STAPLER VISISTAT 35W (STAPLE) IMPLANT
STRIP CLOSURE SKIN 1/2X4 (GAUZE/BANDAGES/DRESSINGS) ×1 IMPLANT
SUT PDS AB 0 CT1 27 (SUTURE) ×6 IMPLANT
SUT VIC AB 0 CT1 27 (SUTURE) ×4
SUT VIC AB 0 CT1 27XCR 8 STRN (SUTURE) ×2 IMPLANT
SUT VIC AB 0 CT1 36 (SUTURE) ×3 IMPLANT
SUT VIC AB 2-0 CT1 (SUTURE) IMPLANT
SUT VIC AB 2-0 CT1 27 (SUTURE) ×2
SUT VIC AB 2-0 CT1 TAPERPNT 27 (SUTURE) ×1 IMPLANT
SUT VIC AB 2-0 SH 27 (SUTURE) ×2
SUT VIC AB 2-0 SH 27XBRD (SUTURE) ×1 IMPLANT
SUT VIC AB 4-0 KS 27 (SUTURE) IMPLANT
SUT VICRYL 0 TIES 12 18 (SUTURE) ×3 IMPLANT
SYR CONTROL 10ML LL (SYRINGE) IMPLANT
TOWEL OR 17X24 6PK STRL BLUE (TOWEL DISPOSABLE) ×6 IMPLANT
TRAY FOLEY W/BAG SLVR 14FR (SET/KITS/TRAYS/PACK) ×3 IMPLANT

## 2018-07-08 NOTE — Transfer of Care (Signed)
Immediate Anesthesia Transfer of Care Note  Patient: Jennifer SpatesAmber B Bates  Procedure(s) Performed: HYSTERECTOMY ABDOMINAL WITH SALPINGECTOMY (Bilateral Abdomen)  Patient Location: PACU  Anesthesia Type:General  Level of Consciousness: awake, alert  and oriented  Airway & Oxygen Therapy: Patient Spontanous Breathing and Patient connected to nasal cannula oxygen  Post-op Assessment: Report given to RN, Post -op Vital signs reviewed and stable and Patient moving all extremities  Post vital signs: Reviewed and stable  Last Vitals:  Vitals Value Taken Time  BP 136/85 07/08/2018  4:30 PM  Temp 36.9 C 07/08/2018  4:30 PM  Pulse 92 07/08/2018  4:34 PM  Resp 21 07/08/2018  4:34 PM  SpO2 100 % 07/08/2018  4:34 PM  Vitals shown include unvalidated device data.  Last Pain:  Vitals:   07/08/18 1224  TempSrc: Oral  PainSc: 0-No pain      Patients Stated Pain Goal: 4 (07/08/18 1224)  Complications: No apparent anesthesia complications

## 2018-07-08 NOTE — Op Note (Signed)
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   Pre-operative Diagnosis: Moderate cervical dysplasia CIN II  Post-operative Diagnosis: Same   Operation: Total abdominal hysterectomy with bilateral salpingectomy   Surgeon: Jessee AversOLE,Buzz Axel J.   Assistants: Dr. Myna HidalgoJennifer Ozan   Anesthesia: General endotracheal anesthesia  ASA Class: 2   Finding: normal appearing uterus  Fallopian tubes and ovaries.   Procedure Details  The patient was seen in the Holding Room. The risks, benefits, complications, treatment options, and expected outcomes were discussed with the patient.  The patient concurred with the proposed plan, giving informed consent.  The site of surgery properly noted/marked. The patient was taken to Operating Room # 7, identified as Jennifer Bates and the procedure verified as Total abdominal hysterectomy with bilatera salpingectomy . A Time Out was held and the above information confirmed.  After induction of anesthesia, the patient was draped and prepped in the usual sterile manner. Pt was placed in supine position after anesthesia and draped and prepped in the usual sterile manner. Foley catheter was placed.  A pfannenstiehl incision was made and carried through the subcutaneous tissue to the fascia. Fascial incision was made and extended Laterally . The rectus muscles were separated. The peritoneum was identified and entered. Peritoneal incision was extended longitudinally.  The above findings were noted. Andrey Spearman'Connor Sullivan  retractor was placed and bowel was packed away from the surgical site.   The round ligaments were identified, cut, and ligated with 0-Vicryl. The anterior peritoneal reflection was incised and the bladder was dissected off the lower uterine segment.  The right utero-ovarian ligament and proximal  fallopian tube were grasped, cut, and suture ligated with 0-Vicryl. The left utero-ovarian ligament and proximal fallopian tube were grasped, cut and suture ligated with 0-Vicryl. Hemostasis  was observed. The uterine vessels were skeletonized, then clamped, cut and suture ligated with 0-Vicryl suture. Serial pedicles of the cardinal and utero-sacral ligaments were clamped, cut, and suture ligated with 0-Vicryl. Entrance was made into the vagina and the uterus removed. Vaginal cuff angle sutures were placed incorporating the utero-sacral ligaments for support. The vaginal cuff was then closed with a running stitch of 0- Vicryl. Attention was turned to the right mesosalpinx which was transected. The right fallopian tube with clamped inferiorly with heany clamp. The fallopian tube was excised. The pedicle of the mesosalpinx was suture ligated with 0 vicryl. This was repeated on the left mesosalpinx and left fallopian tube.   Lavage was carried out until clear. Hemostasis was observed. Arista was placed over the vaginal cuff.   Retractor and all packing was removed from the abdomen. The fascia was approximated with running sutures of 0-Vicryl. Lavage was again carried out. Hemostasis was observed. The skin was approximated with staples.  Instrument, sponge, and needle counts were correct prior to abdominal closure and at the conclusion of the case.     Estimated Blood Loss:  125 mL         Drains: Foley catheter         Total IV Fluids: 2000 ml         Specimens: uterus cervix and bilateral fallopian tubes          Implants: None         Complications:  None; patient tolerated the procedure well.  Disposition: PACU - hemodynamically stable.         Condition: stable  Attending Attestation: I performed the procedure.

## 2018-07-08 NOTE — H&P (Signed)
Chief Complaint(s):   recurrent cervical dysplasia   HPI:  General 37 y/o presents for preop history and physical exam . she is scheduled for total abdominal hysterectomy due to recurrent cervical dysplaasia. colposcopy results from 04/20/2018. ECC shows CIN I.. Cervical polyp that was removed shows CIN I and II.Jennifer Bates Her history is significant for LGSIL pap smear with high risk hpv detected 09/2017. She was referred by NP Levonne Lapping.  she was noted to have a cervical polyp on exam with her first visit with me.  she has a h/o Cervical dysplasia in the past treated with LEEP twice. she is interested in hysterectomy due to recurrent cervical dysplasia.Jennifer Bates she has not interest in future childbearing. Current Medication: Taking  OTC Juice Plus Suppliment one per day     Pantoprazole Sodium 40 MG Tablet Delayed Release 1 tablet Orally Once a day, Notes: prn     Vyvanse(Lisdexamfetamine Dimesylate) 60 MG Capsule 1 capsule in the morning Orally Once a day, Notes: see dose change     Valacyclovir HCl 500 MG Tablet 1 tablet Orally Once a day     Slow Fe(Iron) 142 (45 Fe) MG Tablet Extended Release 1 tablet Orally Once a day     Probiotic - Tablet Delayed Release 1 tablet Orally once a day   Not-Taking  MetroGel-Vaginal(metroNIDAZOLE) 0.75 % Gel 1 applicatorful at bedtime Vaginal Once a day     Diflucan(Fluconazole) 200 MG Tablet 1 tablet Orally Once a day     Medication List reviewed and reconciled with the patient   Medical History:  ADD     SLE- rheumatology- Dr. Nadyne Coombes      Allergies/Intolerance:  Bactrim - rash     Strattera - rash     Concertra - tachycardia     Focalin XR 20mg  - tachycardia   Gyn History:  Sexual activity currently sexually active. Periods : every 28 days, heavy blood loss. LMP 06/13/18. Birth control vasectomy. Last pap smear date 10/17/17-high risk HPV . Abnormal pap smear treated with LEEPx2 assessed with colposcopy. STD HSV 2 .   OB History:  Number of  pregnancies 2. Pregnancy # 1 live birth, C-section delivery, boy. Pregnancy # 2 live birth, C-section, girl.   Surgical History:  laproscopy- 2010 or 2011 (done by OB to look for endometriosis)     c-section- 08/26/2003 & 05/13/2005     LEEP 1998     LEEP 1999     crysurgery for cervical dysplasia 1997   Hospitalization:  childbirth- 2004/2006   Family History:  Father: alive, htn, dm, diagnosed with Diabetes, Hypertension    Mother: alive, mitral valve prolapse    Paternal Grand Mother: deceased, ovarian and uterine cancer    Brother 1: deceased    1 son(s) , 1 daughter(s) .    no early heart disease/no colon, breast cancer.  Social History: General no EXPOSURE TO PASSIVE SMOKE.  no Alcohol.  Children: 2 - one son and one daughter, 3 foster children.  no Caffeine.  DIET: vegan to help with inflammation related to RA.  Tobacco use cigarettes: Never smoked, Tobacco history last updated 06/25/2018.  Marital Status: married.  no Recreational drug use.  OCCUPATION: employed, Charity fundraiser.  no Exercise.   ROS: CONSTITUTIONAL No" options="no,yes" propid="91" itemid="193425" categoryid="10464" encounterid="[id number]"Chills No. No" options="no,yes" propid="91" itemid="172899" categoryid="10464" encounterid="[id number]"Fatigue No. No" options="no,yes" propid="91" itemid="10467" categoryid="10464" encounterid="[id number]"Fever No. No" options="no,yes" propid="91" itemid="193426" categoryid="10464" encounterid="[id number]"Night sweats No. No" options="no,yes" propid="91" itemid="444261" categoryid="10464" encounterid="[id number]"Recent travel outside [place] No. No" options="no,yes" propid="91" itemid="193427"  categoryid="10464" encounterid="[id number]"Sweats No. No" options="no,yes" propid="91" itemid="194825" categoryid="10464" encounterid="[id number]"Weight change No.  OPHTHALMOLOGY no" options="no,yes" propid="91" itemid="12520" categoryid="12516" encounterid="[id number]"Blurring of vision no. no"  options="no,yes" propid="91" itemid="193469" categoryid="12516" encounterid="[id number]"Change in vision no. no" options="no,yes" propid="91" itemid="194379" categoryid="12516" encounterid="[id number]"Double vision no.  ENT no" options="no,yes" propid="91" itemid="193612" categoryid="10481" encounterid="[id number]"Dizziness no. Nose bleeds no. Sore throat no. Teeth pain no.  ALLERGY no" options="no,yes" propid="91" itemid="202589" categoryid="138152" encounterid="[id number]"Hives no.  CARDIOLOGY no" options="no,yes" propid="91" itemid="193603" categoryid="10488" encounterid="[id number]"Chest pain no. no" options="no,yes" propid="91" itemid="199089" categoryid="10488" encounterid="[id number]"High blood pressure no. no" options="no,yes" propid="91" itemid="202598" categoryid="10488" encounterid="[id number]"Irregular heart beat no. no" options="no,yes" propid="91" itemid="10491" categoryid="10488" encounterid="[id number]"Leg edema no. no" options="no,yes" propid="91" itemid="10490" categoryid="10488" encounterid="[id number]"Palpitations no.  RESPIRATORY no" options="no" propid="91" itemid="270013" categoryid="138132" encounterid="[id number]"Shortness of breath no. no" options="no,yes" propid="91" itemid="172745" categoryid="138132" encounterid="[id number]"Cough no. no" options="no,yes" propid="91" itemid="193621" categoryid="138132" encounterid="[id number]"Wheezing no.  UROLOGY no" options="no,yes" propid="91" [phone]" categoryid="138166" encounterid="[id number]"Pain with urination no. no" options="no,yes" propid="91" itemid="193493" categoryid="138166" encounterid="[id number]"Urinary urgency no. no" options="no,yes" propid="91" itemid="193492" categoryid="138166" encounterid="[id number]"Urinary frequency no. no" options="no,yes" propid="91" itemid="138171" categoryid="138166" encounterid="[id number]"Urinary incontinence no. No" options="no,yes" propid="91" itemid="138167" categoryid="138166" encounterid="[id number]"Difficulty  urinating No. No" options="no,yes" propid="91" itemid="138168" categoryid="138166" encounterid="[id number]"Blood in urine No.  GASTROENTEROLOGY no" options="no,yes" propid="91" itemid="10496" categoryid="10494" encounterid="[id number]"Abdominal pain no. no" options="no,yes" propid="91" itemid="193447" categoryid="10494" encounterid="[id number]"Appetite change no. no" options="no,yes" propid="91" itemid="193448" categoryid="10494" encounterid="[id number]"Bloating/belching no. no" options="no,yes" propid="91" itemid="10503" categoryid="10494" encounterid="[id number]"Blood in stool or on toilet paper no. no" options="no,yes" propid="91" itemid="199106" categoryid="10494" encounterid="[id number]"Change in bowel movements no. no" options="no,yes" propid="91" itemid="10501" categoryid="10494" encounterid="[id number]"Constipation no. no" options="no,yes" propid="91" itemid="10502" categoryid="10494" encounterid="[id number]"Diarrhea no. no" options="no,yes" propid="91" itemid="199104" categoryid="10494" encounterid="[id number]"Difficulty swallowing no. no" options="no,yes" propid="91" itemid="10499" categoryid="10494" encounterid="[id number]"Nausea no.  FEMALE REPRODUCTIVE no" options="no,yes" propid="91" itemid="453725" categoryid="10525" encounterid="[id number]"Vulvar pain no. no" options="no,yes" propid="91" itemid="453726" categoryid="10525" encounterid="[id number]"Vulvar rash no. no" options="no, yes" propid="91" itemid="444315" categoryid="10525" encounterid="[id number]"Abnormal vaginal bleeding no. no" options="no,yes" propid="91" itemid="186083" categoryid="10525" encounterid="[id number]"Breast pain no. no" options="no,yes" propid="91" itemid="186084" categoryid="10525" encounterid="[id number]"Nipple discharge no. no" options="no,yes" propid="91" itemid="275823" categoryid="10525" encounterid="[id number]"Pain with intercourse no. no" options="no,yes" propid="91" itemid="186082" categoryid="10525" encounterid="[id number]"Pelvic pain no. no"  options="no,yes" propid="91" itemid="278230" categoryid="10525" encounterid="[id number]"Unusual vaginal discharge no. no" options="no,yes" propid="91" itemid="278942" categoryid="10525" encounterid="[id number]"Vaginal itching no.  MUSCULOSKELETAL no" options="no,yes" propid="91" itemid="193461" categoryid="10514" encounterid="[id number]"Muscle aches no.  NEUROLOGY no" options="no,yes" propid="91" itemid="12513" categoryid="12512" encounterid="[id number]"Headache no. no" options="no,yes" propid="91" itemid="12514" categoryid="12512" encounterid="[id number]"Tingling/numbness no. no" options="no,yes" propid="91" itemid="193468" categoryid="12512" encounterid="[id number]"Weakness no.  PSYCHOLOGY no" options="" propid="91" itemid="275919" categoryid="10520" encounterid="[id number]"Depression no. no" options="no,yes" propid="91" itemid="172748" categoryid="10520" encounterid="[id number]"Anxiety no. no" options="no,yes" propid="91" itemid="199158" categoryid="10520" encounterid="[id number]"Nervousness no. no" options="no,yes" propid="91" itemid="12502" categoryid="10520" encounterid="[id number]"Sleep disturbances no. no " options="no,yes" propid="91" itemid="72718" categoryid="10520" encounterid="[id number]"Suicidal ideation no .  ENDOCRINOLOGY no" options="no,yes" propid="91" itemid="194628" categoryid="12508" encounterid="[id number]"Excessive thirst no. no" options="no,yes" propid="91" itemid="196285" categoryid="12508" encounterid="[id number]"Excessive urination no. no" options="no, yes" propid="91" itemid="444314" categoryid="12508" encounterid="[id number]"Hair loss no. no" options="" propid="91" itemid="447284" categoryid="12508" encounterid="[id number]"Heat or cold intolerance no.  HEMATOLOGY/LYMPH no" options="no,yes" propid="91" itemid="199152" categoryid="138157" encounterid="[id number]"Abnormal bleeding no. no" options="no,yes" propid="91" itemid="170653" categoryid="138157" encounterid="[id number]"Easy bruising no. no" options="no,yes"  propid="91" itemid="138158" categoryid="138157" encounterid="[id number]"Swollen glands no.  DERMATOLOGY no" options="no,yes" propid="91" itemid="199126" categoryid="12503" encounterid="[id number]"New/changing skin lesion no. no" options="no,yes" propid="91" itemid="12504" categoryid="12503" encounterid="[id number]"Rash no. no" options="" propid="91" itemid="444313" categoryid="12503" encounterid="[id number]"Sores no.   Negative except as stated in HPI.  Objective: Vitals: Wt 148, Wt change -1 lb, Ht 64.5, BMI 25.01, Pulse sitting 93, BP sitting 110/74  Past Results: Examination:  General Examination alert, oriented, NAD " categoryPropId="10089" examid="193638"CONSTITUTIONAL: alert, oriented, NAD .  moist, warm" categoryPropId="10109" examid="193638"SKIN: moist, warm.  Conjunctiva clear" categoryPropId="21468" examid="193638"EYES: Conjunctiva clear.  clear to auscultation bilaterally" categoryPropId="87" examid="193638"LUNGS: clear to auscultation bilaterally.  Regular rate and rhythm" categoryPropId="86" examid="193638"HEART: Regular rate and rhythm.  soft, non-tender/non-distended, bowel sounds present " categoryPropId="88" examid="193638"ABDOMEN: soft, non-tender/non-distended, bowel  sounds present .  normal external genitalia, labia - unremarkable, vagina - pink moist mucosa, no lesions or abnormal discharge, cervix - no discharge or lesions or CMT Her cervix is flush with the vaginal mucosa..., adnexa - no masses or tenderness, uterus - nontender and normal size on palpation " categoryPropId="13414" examid="193638"FEMALE GENITOURINARY: normal external genitalia, labia - unremarkable, vagina - pink moist mucosa, no lesions or abnormal discharge, cervix - no discharge or lesions or CMT Her cervix is flush with the vaginal mucosa..., adnexa - no masses or tenderness, uterus - nontender and normal size on palpation .  affect normal, good eye contact" categoryPropId="16316" examid="193638"PSYCH: affect  normal, good eye contact.  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 h/o LEEP x 2 and cervix that is flush with the vaginal mucosa. plan total abominal hysterectomy with bilateral salpingectomy. r/b/a of surgery were discusses with the patient including but not limited to infection/ bleeding/ damage to bowel bladder ureters and surrounding organs with the need for further surgery. Pt voiced understanding and desires to proceed.

## 2018-07-08 NOTE — Anesthesia Procedure Notes (Signed)
Procedure Name: Intubation Date/Time: 07/08/2018 2:33 PM Performed by: Cleda ClarksBrowder, Kaoir Loree R, CRNA Pre-anesthesia Checklist: Patient identified, Emergency Drugs available, Suction available and Patient being monitored Patient Re-evaluated:Patient Re-evaluated prior to induction Oxygen Delivery Method: Circle system utilized Preoxygenation: Pre-oxygenation with 100% oxygen Induction Type: IV induction Ventilation: Mask ventilation without difficulty Laryngoscope Size: Miller and 2 Grade View: Grade I Tube type: Oral Tube size: 7.0 mm Number of attempts: 1 Airway Equipment and Method: Stylet and Oral airway Placement Confirmation: ETT inserted through vocal cords under direct vision,  positive ETCO2 and breath sounds checked- equal and bilateral Secured at: 20 cm Tube secured with: Tape Dental Injury: Teeth and Oropharynx as per pre-operative assessment

## 2018-07-08 NOTE — H&P (Deleted)
  The note originally documented on this encounter has been moved the the encounter in which it belongs.  

## 2018-07-08 NOTE — H&P (Signed)
Date of Initial H&P:07/08/2018  History reviewed, patient examined, no change in status, stable for surgery.

## 2018-07-08 NOTE — Anesthesia Postprocedure Evaluation (Signed)
Anesthesia Post Note  Patient: Laurence SpatesAmber B Vernet  Procedure(s) Performed: HYSTERECTOMY ABDOMINAL WITH SALPINGECTOMY (Bilateral Abdomen)     Patient location during evaluation: PACU Anesthesia Type: General Level of consciousness: awake and alert and oriented Pain management: pain level controlled Vital Signs Assessment: post-procedure vital signs reviewed and stable Respiratory status: spontaneous breathing, nonlabored ventilation and respiratory function stable Cardiovascular status: blood pressure returned to baseline and stable Postop Assessment: no apparent nausea or vomiting Anesthetic complications: no    Last Vitals:  Vitals:   07/08/18 1645 07/08/18 1700  BP: (!) 139/93 (!) 140/95  Pulse: 88 87  Resp: 18 16  Temp:    SpO2: 100% 100%    Last Pain:  Vitals:   07/08/18 1700  TempSrc:   PainSc: 6    Pain Goal: Patients Stated Pain Goal: 4 (07/08/18 1224)               Danny Yackley A.

## 2018-07-08 NOTE — Anesthesia Preprocedure Evaluation (Addendum)
Anesthesia Evaluation  Patient identified by MRN, date of birth, ID band Patient awake    Reviewed: Allergy & Precautions, NPO status , Patient's Chart, lab work & pertinent test results  Airway Mallampati: I  TM Distance: >3 FB Neck ROM: Full    Dental no notable dental hx. 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:   Pulmonary neg pulmonary ROS,    Pulmonary exam normal breath sounds clear to auscultation       Cardiovascular negative cardio ROS Normal cardiovascular exam Rhythm:Regular Rate:Normal     Neuro/Psych PSYCHIATRIC DISORDERS Depression ADHDnegative neurological ROS     GI/Hepatic negative GI ROS, Neg liver ROS,   Endo/Other  negative endocrine ROS  Renal/GU negative Renal ROS     Musculoskeletal SLE   Abdominal   Peds  Hematology  (+) anemia , Hx/o thrombocytopenia   Anesthesia Other Findings   Reproductive/Obstetrics CIN II                           Anesthesia Physical Anesthesia Plan  ASA: II  Anesthesia Plan: General   Post-op Pain Management:    Induction: Intravenous  PONV Risk Score and Plan: 4 or greater and Ondansetron, Dexamethasone, Midazolam and Scopolamine patch - Pre-op  Airway Management Planned: Oral ETT  Additional Equipment:   Intra-op Plan:   Post-operative Plan: Extubation in OR  Informed Consent: I have reviewed the patients History and Physical, chart, labs and discussed the procedure including the risks, benefits and alternatives for the proposed anesthesia with the patient or authorized representative who has indicated his/her understanding and acceptance.   Dental advisory given  Plan Discussed with: CRNA and Surgeon  Anesthesia Plan Comments:         Anesthesia Quick Evaluation

## 2018-07-09 ENCOUNTER — Encounter (HOSPITAL_COMMUNITY): Payer: Self-pay | Admitting: Obstetrics and Gynecology

## 2018-07-09 LAB — CBC
HCT: 28.4 % — ABNORMAL LOW (ref 36.0–46.0)
Hemoglobin: 9.8 g/dL — ABNORMAL LOW (ref 12.0–15.0)
MCH: 31.2 pg (ref 26.0–34.0)
MCHC: 34.5 g/dL (ref 30.0–36.0)
MCV: 90.4 fL (ref 78.0–100.0)
PLATELETS: 98 10*3/uL — AB (ref 150–400)
RBC: 3.14 MIL/uL — AB (ref 3.87–5.11)
RDW: 13.2 % (ref 11.5–15.5)
WBC: 4.2 10*3/uL (ref 4.0–10.5)

## 2018-07-09 MED ORDER — OXYCODONE-ACETAMINOPHEN 5-325 MG PO TABS: 1.0000 | ORAL_TABLET | ORAL | 0 refills | Status: AC | PRN

## 2018-07-09 MED ORDER — BISACODYL 10 MG RE SUPP
10.0000 mg | Freq: Once | RECTAL | Status: AC
  Administered 2018-07-09: 10 mg via RECTAL
  Filled 2018-07-09: qty 1

## 2018-07-09 MED ORDER — IBUPROFEN 800 MG PO TABS: 800.0000 mg | ORAL_TABLET | Freq: Three times a day (TID) | ORAL | 0 refills | Status: AC | PRN

## 2018-07-09 NOTE — Progress Notes (Signed)
1 Day Post-Op Procedure(s) (LRB): HYSTERECTOMY ABDOMINAL WITH SALPINGECTOMY (Bilateral)  Subjective: Patient reports tolerating PO.   Ambulating without difficulty no flatus  Foley still in   Objective: I have reviewed patient's vital signs, intake and output, medications and labs.  General: alert, cooperative and no distress GI: soft appropriately tender nondistened +BS in all 4 quadrants  Extremities: extremities normal, atraumatic, no cyanosis or edema  Assessment: s/p Procedure(s): HYSTERECTOMY ABDOMINAL WITH SALPINGECTOMY (Bilateral): stable  Plan: Encourage ambulation Discontinue IV fluids  Remove foley Pain control- Ibuprofen and percocet prn  Awaiting bowel and bladder function.  D/C home in AM if return of bowel and bladder function.  Follow up in office in 2 weeks   LOS: 1 day    Jennifer Bates J. 07/09/2018, 6:50 PM

## 2018-07-09 NOTE — Plan of Care (Signed)
  Problem: Clinical Measurements: Goal: Ability to maintain clinical measurements within normal limits will improve Outcome: Progressing   Problem: Health Behavior/Discharge Planning: Goal: Ability to manage health-related needs will improve Outcome: Progressing   Problem: Education: Goal: Knowledge of General Education information will improve Description: Including pain rating scale, medication(s)/side effects and non-pharmacologic comfort measures Outcome: Progressing   Problem: Clinical Measurements: Goal: Will remain free from infection Outcome: Progressing   

## 2018-07-10 MED ORDER — POLYETHYLENE GLYCOL 3350 17 G PO PACK
17.0000 g | PACK | Freq: Every day | ORAL | Status: DC
  Administered 2018-07-10: 17 g via ORAL
  Filled 2018-07-10: qty 1

## 2018-07-10 NOTE — Progress Notes (Signed)
2 Days Post-Op Procedure(s) (LRB): HYSTERECTOMY ABDOMINAL WITH SALPINGECTOMY (Bilateral)  Subjective: Patient reports tolerating PO.   Pain controlled.  Ambulating well.  +flatus. 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  She has been working on it for 1 year.  Friend will stand in for her.  Objective: I have reviewed patient's vital signs, intake and output, medications and labs.  Gen:  NAD, A&O x 3 Abd:  Dressing clean, nondistended, nontender Ext:  No edema or calf tenderness  Assessment: s/p Procedure(s): HYSTERECTOMY ABDOMINAL WITH SALPINGECTOMY (Bilateral): stable  Plan: Discharge home. Pt advised that it would not be in her best interest to go to the conference 2 days after having major surgery. Pt to F/u with Dr. Richardson Doppole in 2 weeks.   LOS: 2 days    Jennifer Bates 07/10/2018, 8:11 AM

## 2018-07-10 NOTE — Progress Notes (Signed)
Discharge teaching complete with pt. Pt understood all instructions and did not have any questions. Pt discharged home to family.  

## 2018-07-29 NOTE — Discharge Summary (Signed)
Physician Discharge Summary  Patient ID: Jennifer Bates MRN: [id number] DOB/AGE: 05-13-1981 37 y.o.  Admit date: 07/08/2018 Discharge date: 07/29/2018  Admission Diagnoses:Cervical Dysplasia  Discharge Diagnoses:  Active Problems:   S/P TAH (total abdominal hysterectomy)   Discharged Condition: good  Hospital Course: Uncomplicated hospital course  Consults: None  Significant Diagnostic Studies: None  Treatments: surgery: See above.  Discharge Exam: Blood pressure 121/78, pulse (!) 115, temperature 98.6 F (37 C), resp. rate 17, height 5\' 4"  (1.626 m), weight 66.7 kg, last menstrual period 06/17/2018, SpO2 98 %.  See progress note.  Disposition:   Discharge Instructions     Remove dressing in 72 hours   Complete by:  As directed    Call MD for:  persistant nausea and vomiting   Complete by:  As directed    Call MD for:  redness, tenderness, or signs of infection (pain, swelling, redness, odor or green/yellow discharge around incision site)   Complete by:  As directed    Call MD for:  severe uncontrolled pain   Complete by:  As directed    Call MD for:  temperature >100.4   Complete by:  As directed    Diet general   Complete by:  As directed    Driving Restrictions   Complete by:  As directed    Avoid driving for 2 weeks   Increase activity slowly   Complete by:  As directed    Lifting restrictions   Complete by:  As directed    Avoid lifting over 10 lbs   Sexual Activity Restrictions   Complete by:  As directed    Avoid sexual activity     Allergies as of 07/10/2018      Reactions   Strattera [atomoxetine Hcl] Rash   Sulfa Antibiotics Rash      Medication List    TAKE these medications   acetaminophen 500 MG tablet Commonly known as:  TYLENOL Take 1,000 mg by mouth every 6 (six) hours as needed (for pain.).   ADEKS PO Take 1 capsule by mouth at bedtime.   ibuprofen 800 MG tablet Commonly known as:  ADVIL,MOTRIN Take 1 tablet (800 mg total) by  mouth every 8 (eight) hours as needed (mild pain).   oxyCODONE-acetaminophen 5-325 MG tablet Commonly known as:  PERCOCET/ROXICET Take 1-2 tablets by mouth every 4 (four) hours as needed for severe pain ((when tolerating fluids)).   PROBIOTIC PO Take 1 capsule by mouth at bedtime.   SLOW FE PO Take 1 tablet by mouth every evening.   valACYclovir 500 MG tablet Commonly known as:  VALTREX Take 500 mg by mouth every evening.   VYVANSE 60 MG capsule Generic drug:  lisdexamfetamine Take 60 mg by mouth See admin instructions. Take 1 capsule (60 mg) by mouth on work days.      Follow-up Information    Gerald Leitz, MD. Go in 2 week(s).   Specialty:  Obstetrics and Gynecology Why:  patient already has a postoperative visit  Contact information: [address] [place] [address] [phone]           Signed: Geryl Rankins 07/29/2018, 1:18 PM

## 2019-08-19 ENCOUNTER — Other Ambulatory Visit: Payer: Self-pay | Admitting: Family Medicine

## 2019-08-19 DIAGNOSIS — Z1231 Encounter for screening mammogram for malignant neoplasm of breast: Secondary | ICD-10-CM

## 2019-10-08 ENCOUNTER — Other Ambulatory Visit: Payer: Self-pay

## 2019-10-08 ENCOUNTER — Ambulatory Visit
Admission: RE | Admit: 2019-10-08 | Discharge: 2019-10-08 | Disposition: A | Discharge status: Home / Self Care | Payer: BC Managed Care – PPO | Source: Ambulatory Visit | Attending: Family Medicine | Admitting: Family Medicine

## 2019-10-08 DIAGNOSIS — Z1231 Encounter for screening mammogram for malignant neoplasm of breast: Secondary | ICD-10-CM

## 2020-09-27 ENCOUNTER — Other Ambulatory Visit: Payer: Self-pay | Admitting: Family Medicine

## 2020-09-27 DIAGNOSIS — Z1231 Encounter for screening mammogram for malignant neoplasm of breast: Secondary | ICD-10-CM

## 2020-10-10 ENCOUNTER — Other Ambulatory Visit: Payer: Self-pay

## 2020-10-10 ENCOUNTER — Ambulatory Visit
Admission: RE | Admit: 2020-10-10 | Discharge: 2020-10-10 | Disposition: A | Discharge status: Home / Self Care | Payer: BC Managed Care – PPO | Source: Ambulatory Visit | Attending: Family Medicine | Admitting: Family Medicine

## 2020-10-10 DIAGNOSIS — Z1231 Encounter for screening mammogram for malignant neoplasm of breast: Secondary | ICD-10-CM

## 2021-09-17 ENCOUNTER — Other Ambulatory Visit: Payer: Self-pay | Admitting: Family Medicine

## 2021-09-17 DIAGNOSIS — Z1231 Encounter for screening mammogram for malignant neoplasm of breast: Secondary | ICD-10-CM

## 2021-10-18 ENCOUNTER — Ambulatory Visit
Admission: RE | Admit: 2021-10-18 | Discharge: 2021-10-18 | Disposition: A | Discharge status: Home / Self Care | Payer: Self-pay | Source: Ambulatory Visit | Attending: Family Medicine | Admitting: Family Medicine

## 2021-10-18 DIAGNOSIS — Z1231 Encounter for screening mammogram for malignant neoplasm of breast: Secondary | ICD-10-CM

## 2022-09-27 ENCOUNTER — Other Ambulatory Visit: Payer: Self-pay | Admitting: Family Medicine

## 2022-09-27 DIAGNOSIS — Z1231 Encounter for screening mammogram for malignant neoplasm of breast: Secondary | ICD-10-CM

## 2022-11-21 ENCOUNTER — Ambulatory Visit
Admission: RE | Admit: 2022-11-21 | Discharge: 2022-11-21 | Disposition: A | Discharge status: Home / Self Care | Payer: BC Managed Care – PPO | Source: Ambulatory Visit

## 2022-11-21 DIAGNOSIS — Z1231 Encounter for screening mammogram for malignant neoplasm of breast: Secondary | ICD-10-CM

## 2023-07-08 IMAGING — MG MM DIGITAL SCREENING BILAT W/ TOMO AND CAD
8 series · 9 of 24 positions shown · non-contrast
Comparison: Previous exam(s).

CLINICAL DATA: Screening.

EXAM:
DIGITAL SCREENING BILATERAL MAMMOGRAM WITH TOMOSYNTHESIS AND CAD
TECHNIQUE: Bilateral screening digital craniocaudal and mediolateral oblique
mammograms were obtained. Bilateral screening digital breast
tomosynthesis was performed. The images were evaluated with
computer-aided detection.

[R CC synth-2D]
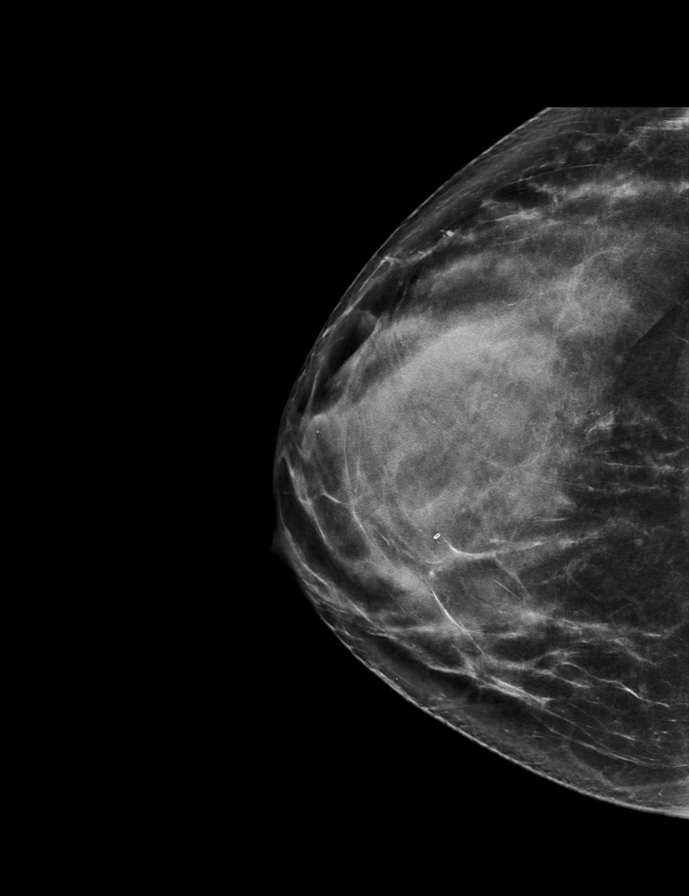

[R MLO synth-2D]
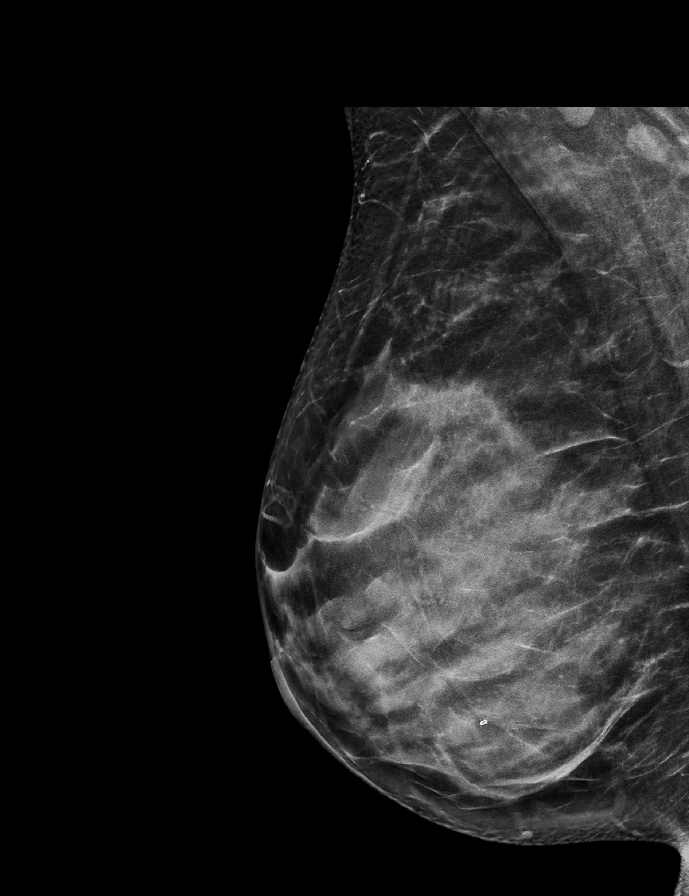

[L MLO synth-2D]
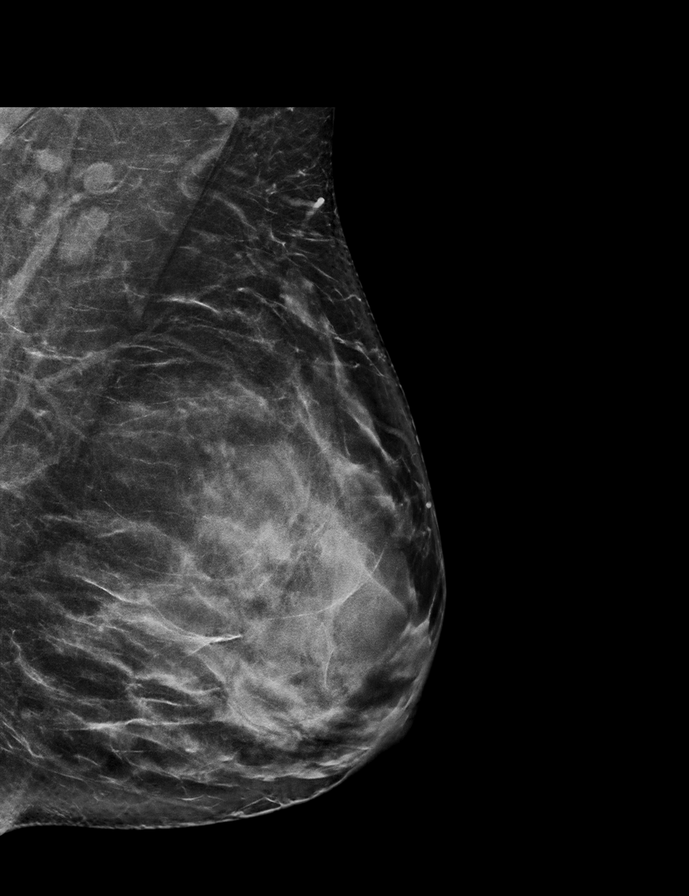

[L CC synth-2D]
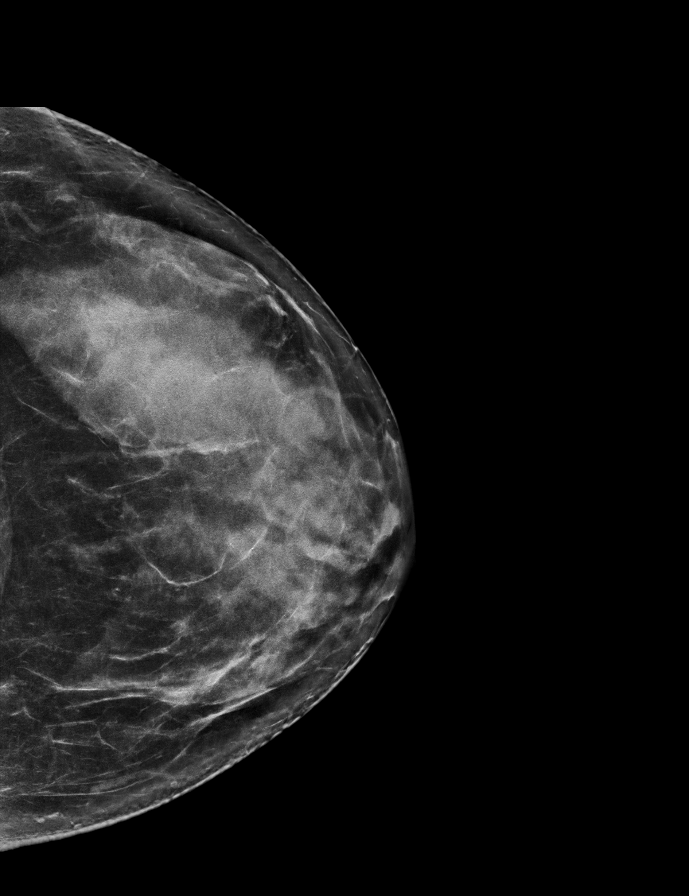

[R MLO tomo · 2 of 73 frames shown]
[frame 24/73]
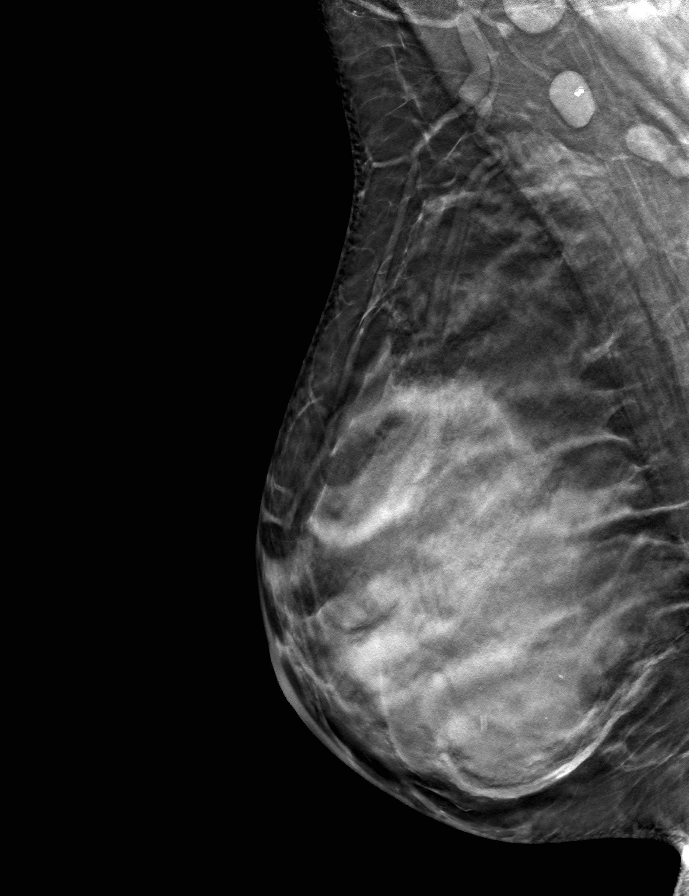
[frame 37/73]
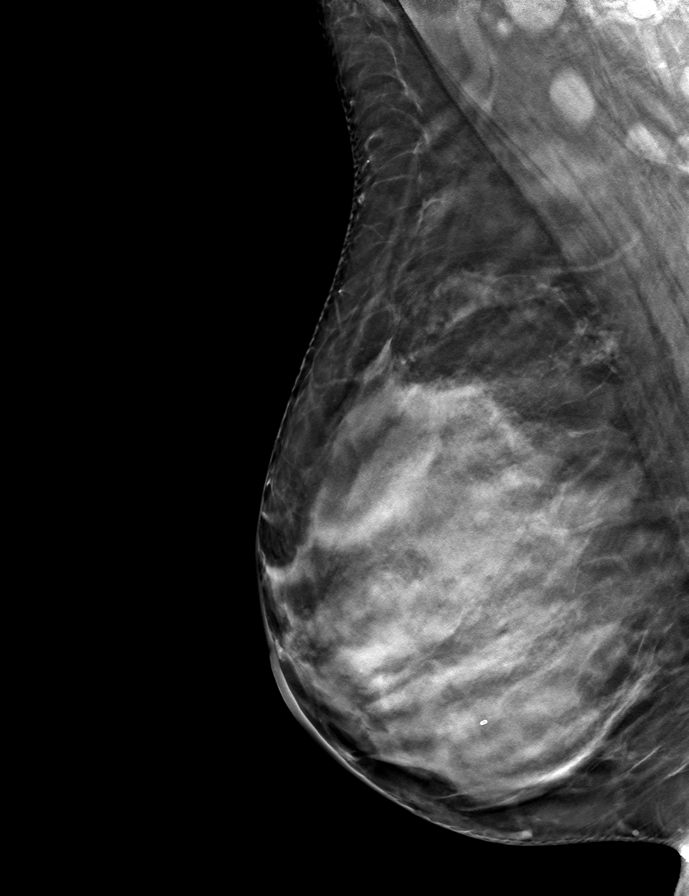

[L CC tomo · tomo slice 37/73.0]
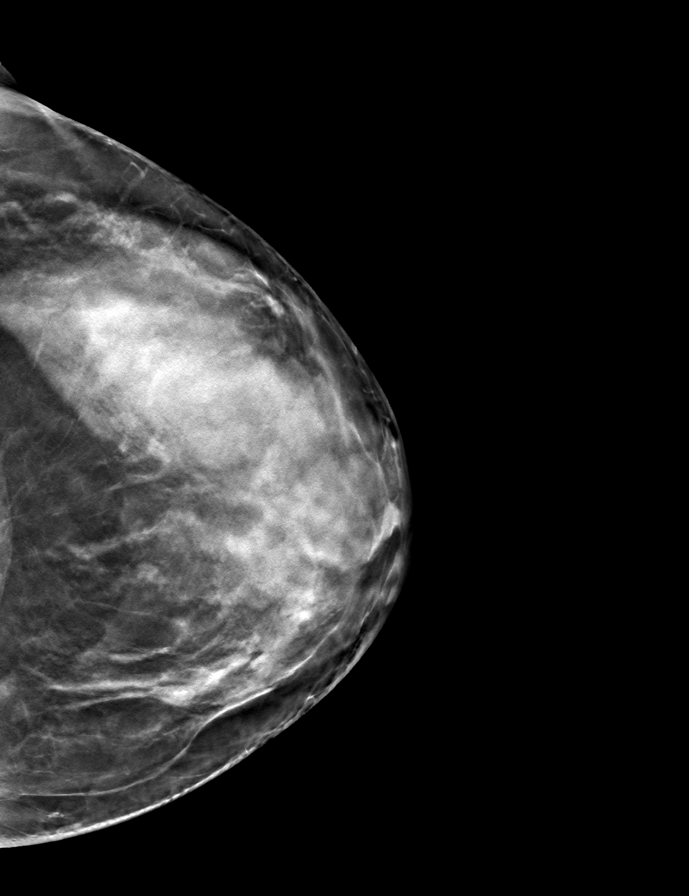

[L MLO tomo · tomo slice 37/74.0]
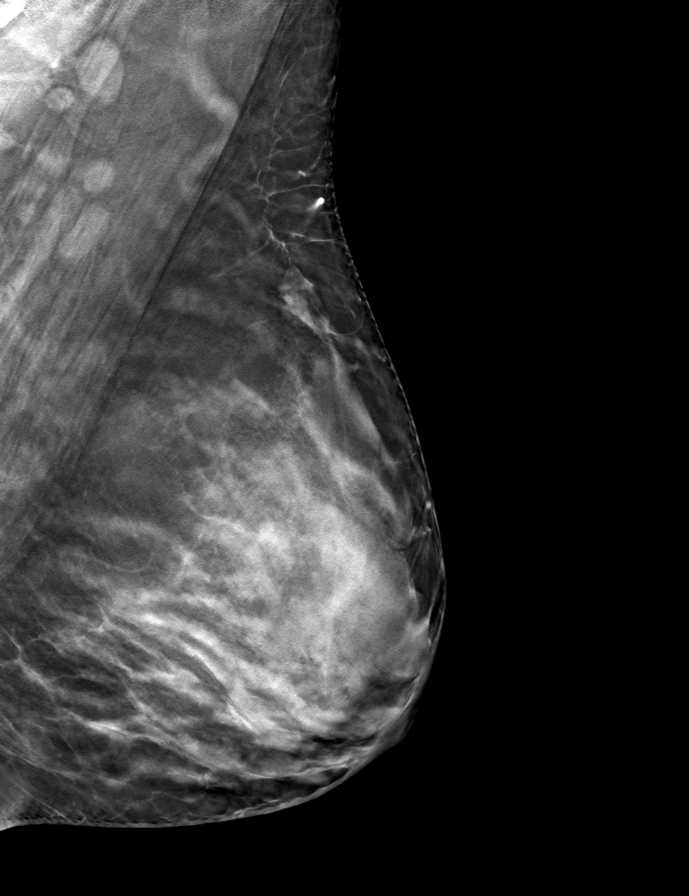

[R CC tomo · tomo slice 38/75.0]
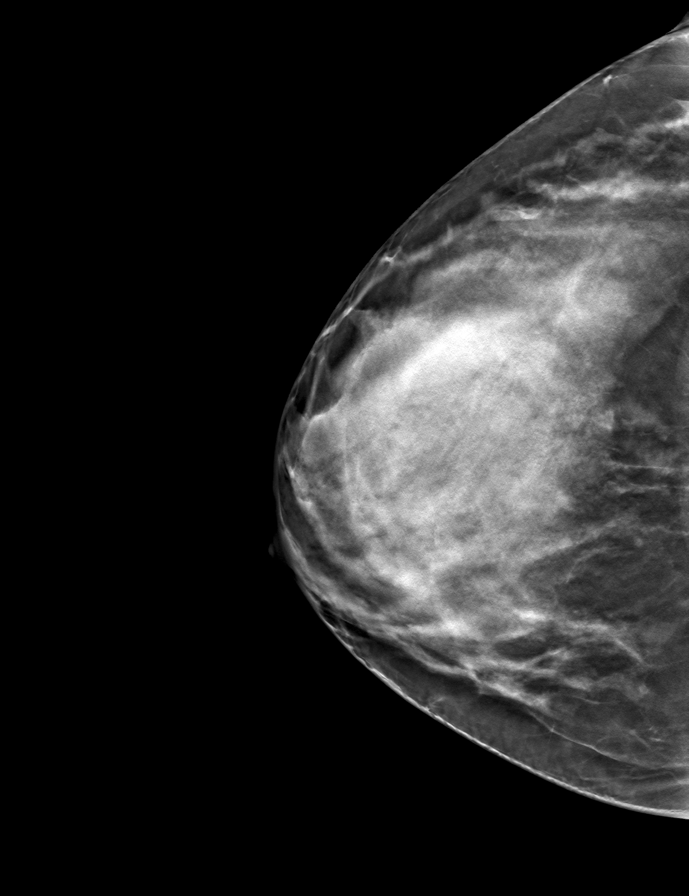

[9 of 24 positions shown; findings below may reference images not displayed]

ACR Breast Density Category d: The breast tissue is extremely dense,
which lowers the sensitivity of mammography
FINDINGS: There are no findings suspicious for malignancy.
IMPRESSION: No mammographic evidence of malignancy. A result letter of this
screening mammogram will be mailed directly to the patient.

RECOMMENDATION:
Screening mammogram in one year. (Code:TA-V-WV9)

BI-RADS CATEGORY  1: Negative.

## 2023-12-04 ENCOUNTER — Other Ambulatory Visit: Payer: Self-pay | Admitting: Family Medicine

## 2023-12-04 DIAGNOSIS — Z1231 Encounter for screening mammogram for malignant neoplasm of breast: Secondary | ICD-10-CM

## 2023-12-16 ENCOUNTER — Ambulatory Visit: Payer: 59

## 2023-12-30 ENCOUNTER — Ambulatory Visit
Admission: RE | Admit: 2023-12-30 | Discharge: 2023-12-30 | Disposition: A | Discharge status: Home / Self Care | Source: Ambulatory Visit | Attending: Family Medicine | Admitting: Family Medicine

## 2023-12-30 DIAGNOSIS — Z1231 Encounter for screening mammogram for malignant neoplasm of breast: Secondary | ICD-10-CM
